# Patient Record
Sex: Female | Born: 1995 | Race: White | Hispanic: No | Marital: Single | Smoking: Never smoker
Health system: Southern US, Community
[De-identification: ages and names within clinical notes are randomized; demographics above are authoritative.]

## PROBLEM LIST (undated history)

## (undated) DIAGNOSIS — N200 Calculus of kidney: Secondary | ICD-10-CM

---

## 2014-06-23 ENCOUNTER — Inpatient Hospital Stay (HOSPITAL_BASED_OUTPATIENT_CLINIC_OR_DEPARTMENT_OTHER)
Admission: EM | Admit: 2014-06-23 | Discharge: 2014-06-26 | DRG: 660 | Disposition: A | Discharge status: Home / Self Care | Payer: No Typology Code available for payment source | Attending: Urology | Admitting: Urology

## 2014-06-23 ENCOUNTER — Encounter (HOSPITAL_BASED_OUTPATIENT_CLINIC_OR_DEPARTMENT_OTHER): Payer: Self-pay | Admitting: Emergency Medicine

## 2014-06-23 DIAGNOSIS — N201 Calculus of ureter: Secondary | ICD-10-CM | POA: Diagnosis not present

## 2014-06-23 DIAGNOSIS — N12 Tubulo-interstitial nephritis, not specified as acute or chronic: Secondary | ICD-10-CM

## 2014-06-23 DIAGNOSIS — K5900 Constipation, unspecified: Secondary | ICD-10-CM | POA: Diagnosis present

## 2014-06-23 DIAGNOSIS — Z87442 Personal history of urinary calculi: Secondary | ICD-10-CM

## 2014-06-23 DIAGNOSIS — N132 Hydronephrosis with renal and ureteral calculous obstruction: Secondary | ICD-10-CM | POA: Diagnosis not present

## 2014-06-23 HISTORY — DX: Calculus of kidney: N20.0

## 2014-06-23 LAB — I-STAT CG4 LACTIC ACID, ED: LACTIC ACID, VENOUS: 0.74 mmol/L (ref 0.5–2.0)

## 2014-06-23 LAB — BASIC METABOLIC PANEL
ANION GAP: 6 (ref 5–15)
BUN: 13 mg/dL (ref 6–23)
CO2: 19 mmol/L (ref 19–32)
CREATININE: 1.28 mg/dL — AB (ref 0.50–1.10)
Calcium: 8.3 mg/dL — ABNORMAL LOW (ref 8.4–10.5)
Chloride: 104 mmol/L (ref 96–112)
GFR calc Af Amer: 69 mL/min — ABNORMAL LOW (ref >90)
GFR, EST NON AFRICAN AMERICAN: 60 mL/min — AB (ref >90)
Glucose, Bld: 125 mg/dL — ABNORMAL HIGH (ref 70–99)
Potassium: 4.4 mmol/L (ref 3.5–5.1)
SODIUM: 129 mmol/L — AB (ref 135–145)

## 2014-06-23 LAB — URINALYSIS, ROUTINE W REFLEX MICROSCOPIC
Bilirubin Urine: NEGATIVE
GLUCOSE, UA: NEGATIVE mg/dL
Ketones, ur: NEGATIVE mg/dL
Nitrite: NEGATIVE
PH: 6 (ref 5.0–8.0)
Protein, ur: 100 mg/dL — AB
SPECIFIC GRAVITY, URINE: 1.009 (ref 1.005–1.030)
Urobilinogen, UA: 0.2 mg/dL (ref 0.0–1.0)

## 2014-06-23 LAB — CBC WITH DIFFERENTIAL/PLATELET
BASOS PCT: 0 % (ref 0–1)
Basophils Absolute: 0 10*3/uL (ref 0.0–0.1)
Eosinophils Absolute: 0 10*3/uL (ref 0.0–0.7)
Eosinophils Relative: 0 % (ref 0–5)
HEMATOCRIT: 36.1 % (ref 36.0–46.0)
Hemoglobin: 12.7 g/dL (ref 12.0–15.0)
Lymphocytes Relative: 5 % — ABNORMAL LOW (ref 12–46)
Lymphs Abs: 1 10*3/uL (ref 0.7–4.0)
MCH: 31.6 pg (ref 26.0–34.0)
MCHC: 35.2 g/dL (ref 30.0–36.0)
MCV: 89.8 fL (ref 78.0–100.0)
MONOS PCT: 10 % (ref 3–12)
Monocytes Absolute: 1.9 10*3/uL — ABNORMAL HIGH (ref 0.1–1.0)
Neutro Abs: 17.2 10*3/uL — ABNORMAL HIGH (ref 1.7–7.7)
Neutrophils Relative %: 85 % — ABNORMAL HIGH (ref 43–77)
Platelets: 182 10*3/uL (ref 150–400)
RBC: 4.02 MIL/uL (ref 3.87–5.11)
RDW: 12.5 % (ref 11.5–15.5)
WBC: 20.2 10*3/uL — ABNORMAL HIGH (ref 4.0–10.5)

## 2014-06-23 LAB — URINE MICROSCOPIC-ADD ON

## 2014-06-23 MED ORDER — ACETAMINOPHEN 325 MG PO TABS
650.0000 mg | ORAL_TABLET | Freq: Once | ORAL | Status: AC
  Administered 2014-06-23: 650 mg via ORAL
  Filled 2014-06-23: qty 2

## 2014-06-23 MED ORDER — ONDANSETRON HCL 4 MG/2ML IJ SOLN
4.0000 mg | Freq: Once | INTRAMUSCULAR | Status: AC
  Administered 2014-06-23: 4 mg via INTRAVENOUS
  Filled 2014-06-23: qty 2

## 2014-06-23 MED ORDER — HYDROMORPHONE HCL 1 MG/ML IJ SOLN
1.0000 mg | Freq: Once | INTRAMUSCULAR | Status: AC
Start: 2014-06-23 — End: 2014-06-23
  Administered 2014-06-23: 1 mg via INTRAVENOUS
  Filled 2014-06-23: qty 1

## 2014-06-23 MED ORDER — HYDROMORPHONE HCL 1 MG/ML IJ SOLN
1.0000 mg | Freq: Once | INTRAMUSCULAR | Status: AC
  Administered 2014-06-23: 1 mg via INTRAVENOUS
  Filled 2014-06-23: qty 1

## 2014-06-23 MED ORDER — SODIUM CHLORIDE 0.9 % IV BOLUS (SEPSIS)
1000.0000 mL | Freq: Once | INTRAVENOUS | Status: AC
  Administered 2014-06-23: 1000 mL via INTRAVENOUS

## 2014-06-23 MED ORDER — DEXTROSE 5 % IV SOLN
1.0000 g | Freq: Once | INTRAVENOUS | Status: AC
  Administered 2014-06-23: 1 g via INTRAVENOUS

## 2014-06-23 MED ORDER — CEFTRIAXONE SODIUM 1 G IJ SOLR
INTRAMUSCULAR | Status: AC
  Administered 2014-06-23: 20:00:00
  Filled 2014-06-23: qty 10

## 2014-06-23 NOTE — ED Notes (Signed)
Patient in NAD upon arrival  Urology to be paged

## 2014-06-23 NOTE — ED Notes (Signed)
Patient repots that she was dx with a 3mm kidney stone to her right side. Patient reports that the medications are not helping

## 2014-06-23 NOTE — ED Notes (Signed)
Assisted pt with calling parents, pt left voicemail on fathers mobile.

## 2014-06-23 NOTE — ED Notes (Signed)
[facility] and oriented x 4 and in NAD Patient currently rates pain 4/10 and states that previously given medications have decreased pain level Awaiting urology consult Call bell in reach

## 2014-06-23 NOTE — ED Notes (Signed)
[facility] here, report given, no changes, pain and nausea meds given at time of transfer.

## 2014-06-23 NOTE — ED Notes (Signed)
Bed: [id number] Expected date:  Expected time:  Means of arrival:  Comments: MHP infected stone

## 2014-06-23 NOTE — ED Notes (Signed)
Presents w/ Rt sided flank pain, states unable to pass kidney stone, has some nausea. Facial grimmacing noted

## 2014-06-23 NOTE — ED Notes (Signed)
MD at bedside. 

## 2014-06-23 NOTE — ED Notes (Addendum)
Pending arrival of [facility], [facility], reports pain and nausea remain. VSS. Alert, NAD, calm, interactive.

## 2014-06-23 NOTE — ED Provider Notes (Signed)
CSN: [id number]     Arrival date & time 06/23/14  1801 History   First MD Initiated Contact with Patient 06/23/14 1806     Chief Complaint  Patient presents with  . Flank Pain     (Consider location/radiation/quality/duration/timing/severity/associated sxs/prior Treatment) HPI Comments: Patient presents with uncontrolled right flank pain which started 4 days ago. Patient was diagnosed with a 3 mm right-sided UVJ stone 3 days ago by CT scan. She has been on narcotic pain medications at home with mild relief. Tonight her pain was worse. It is been associated with nausea. No reported fever. No chest pain or shortness of breath. She has been urinating. She notes constipation due to the pain medications. No other treatments prior to arrival.    CT scan from [facility] ED on 06/20/14:  IMPRESSION:High-grade obstruction on the right due to 3 mm stone at the ureterovesical junction.  The history is provided by the patient.    Past Medical History  Diagnosis Date  . Kidney stone    History reviewed. No pertinent past surgical history. History reviewed. No pertinent family history. History  Substance Use Topics  . Smoking status: Never Smoker   . Smokeless tobacco: Not on file  . Alcohol Use: No   OB History    No data available     Review of Systems  Constitutional: Negative for fever.  HENT: Negative for rhinorrhea and sore throat.   Eyes: Negative for redness.  Respiratory: Negative for cough.   Cardiovascular: Negative for chest pain.  Gastrointestinal: Positive for nausea and constipation. Negative for vomiting, abdominal pain and diarrhea.  Genitourinary: Positive for hematuria and flank pain. Negative for dysuria.  Musculoskeletal: Negative for myalgias.  Skin: Negative for rash.  Neurological: Negative for headaches.    Allergies  Review of patient's allergies indicates no known allergies.  Home Medications   Prior to Admission medications   Not on File   BP  132/75 mmHg  Pulse 131  Temp(Src) 100.1 F (37.8 C) (Oral)  Resp 16  Ht  (1.727 m)  Wt 190 lb (86.183 kg)  BMI 28.90 kg/m2  SpO2 100%  LMP 06/08/2014   Physical Exam  Constitutional: She appears well-developed and well-nourished. She appears distressed.  HENT:  Head: Normocephalic and atraumatic.  Mouth/Throat: Oropharynx is clear and moist.  Eyes: Conjunctivae are normal. Right eye exhibits no discharge. Left eye exhibits no discharge.  Neck: Normal range of motion. Neck supple.  Cardiovascular: Regular rhythm and normal heart sounds.  Tachycardia present.   Pulmonary/Chest: Effort normal and breath sounds normal.  Abdominal: Soft. There is no tenderness. CVA tenderness: Right.  Neurological: She is alert.  Skin: Skin is warm and dry.  Psychiatric: She has a normal mood and affect.  Nursing note and vitals reviewed.   ED Course  Procedures (including critical care time) Labs Review Labs Reviewed  CBC WITH DIFFERENTIAL/PLATELET - Abnormal; Notable for the following:    WBC 20.2 (*)    Neutrophils Relative % 85 (*)    Neutro Abs 17.2 (*)    Lymphocytes Relative 5 (*)    Monocytes Absolute 1.9 (*)    All other components within normal limits  BASIC METABOLIC PANEL - Abnormal; Notable for the following:    Sodium 129 (*)    Glucose, Bld 125 (*)    Creatinine, Ser 1.28 (*)    Calcium 8.3 (*)    GFR calc non Af Amer 60 (*)    GFR calc Af Amer 69 (*)  All other components within normal limits  URINALYSIS, ROUTINE W REFLEX MICROSCOPIC - Abnormal; Notable for the following:    APPearance CLOUDY (*)    Hgb urine dipstick SMALL (*)    Protein, ur 100 (*)    Leukocytes, UA MODERATE (*)    All other components within normal limits  URINE MICROSCOPIC-ADD ON - Abnormal; Notable for the following:    Squamous Epithelial / LPF MANY (*)    Bacteria, UA MANY (*)    All other components within normal limits  I-STAT CG4 LACTIC ACID, ED    Imaging Review No results  found.   EKG Interpretation None       6:23 PM Patient seen and examined. Work-up initiated. Medications ordered.   Vital signs reviewed and are as follows: BP 132/75 mmHg  Pulse 131  Temp(Src) 100.1 F (37.8 C) (Oral)  Resp 16  Ht 5\' 8"  (1.727 m)  Wt 190 lb (86.183 kg)  BMI 28.90 kg/m2  SpO2 100%  LMP 06/08/2014  9:58 PM Spoke with Dr. Berneice HeinrichManny. [facility].   10:04 PM Spoke with Dr. Patria Maneampos who was made aware patient going to ED.   MDM   Final diagnoses:  Pyelonephritis  Right ureteral stone   Admit, pyelo with ureteral obstruction.     Renne CriglerJoshua Tarl Cephas, PA-C 06/23/14 [id number]  Geoffery Lyonsouglas Delo, MD 06/23/14 [phone]

## 2014-06-24 ENCOUNTER — Inpatient Hospital Stay (HOSPITAL_COMMUNITY): Payer: No Typology Code available for payment source

## 2014-06-24 ENCOUNTER — Emergency Department (HOSPITAL_COMMUNITY): Payer: No Typology Code available for payment source | Admitting: Anesthesiology

## 2014-06-24 ENCOUNTER — Encounter (HOSPITAL_COMMUNITY)
Admission: EM | Disposition: A | Discharge status: Home / Self Care | Payer: Self-pay | Source: Home / Self Care | Attending: Urology

## 2014-06-24 DIAGNOSIS — N132 Hydronephrosis with renal and ureteral calculous obstruction: Secondary | ICD-10-CM | POA: Diagnosis present

## 2014-06-24 DIAGNOSIS — K5900 Constipation, unspecified: Secondary | ICD-10-CM | POA: Diagnosis present

## 2014-06-24 DIAGNOSIS — Z87442 Personal history of urinary calculi: Secondary | ICD-10-CM | POA: Diagnosis not present

## 2014-06-24 DIAGNOSIS — N201 Calculus of ureter: Secondary | ICD-10-CM | POA: Diagnosis present

## 2014-06-24 HISTORY — PX: CYSTOSCOPY W/ URETERAL STENT PLACEMENT: SHX1429

## 2014-06-24 LAB — PREGNANCY, URINE: Preg Test, Ur: NEGATIVE

## 2014-06-24 SURGERY — CYSTOSCOPY, WITH RETROGRADE PYELOGRAM AND URETERAL STENT INSERTION
Anesthesia: General | Site: Ureter | Laterality: Bilateral

## 2014-06-24 MED ORDER — FENTANYL CITRATE (PF) 100 MCG/2ML IJ SOLN
INTRAMUSCULAR | Status: DC | PRN
  Administered 2014-06-24 (×2): 50 ug via INTRAVENOUS

## 2014-06-24 MED ORDER — CEFTRIAXONE SODIUM IN DEXTROSE 40 MG/ML IV SOLN
2.0000 g | INTRAVENOUS | Status: DC
  Administered 2014-06-24 – 2014-06-26 (×3): 2 g via INTRAVENOUS
  Filled 2014-06-24 (×3): qty 50

## 2014-06-24 MED ORDER — IOHEXOL 300 MG/ML  SOLN
INTRAMUSCULAR | Status: DC | PRN
  Administered 2014-06-24: 100 mL

## 2014-06-24 MED ORDER — BELLADONNA ALKALOIDS-OPIUM 16.2-60 MG RE SUPP
RECTAL | Status: AC
  Filled 2014-06-24: qty 1

## 2014-06-24 MED ORDER — KCL IN DEXTROSE-NACL 20-5-0.45 MEQ/L-%-% IV SOLN
INTRAVENOUS | Status: DC
  Administered 2014-06-24: 14:00:00 via INTRAVENOUS
  Administered 2014-06-24: 100 mL/h via INTRAVENOUS
  Administered 2014-06-25 (×2): via INTRAVENOUS
  Filled 2014-06-24 (×5): qty 1000

## 2014-06-24 MED ORDER — DEXAMETHASONE SODIUM PHOSPHATE 10 MG/ML IJ SOLN
INTRAMUSCULAR | Status: DC | PRN
  Administered 2014-06-24: 10 mg via INTRAVENOUS

## 2014-06-24 MED ORDER — DOCUSATE SODIUM 100 MG PO CAPS
100.0000 mg | ORAL_CAPSULE | Freq: Two times a day (BID) | ORAL | Status: DC
  Administered 2014-06-24 – 2014-06-26 (×5): 100 mg via ORAL
  Filled 2014-06-24 (×7): qty 1

## 2014-06-24 MED ORDER — LACTATED RINGERS IV SOLN
INTRAVENOUS | Status: DC | PRN
  Administered 2014-06-24: 01:00:00 via INTRAVENOUS

## 2014-06-24 MED ORDER — LIDOCAINE HCL 2 % EX GEL
CUTANEOUS | Status: AC
  Filled 2014-06-24: qty 10

## 2014-06-24 MED ORDER — OXYCODONE HCL 5 MG PO TABS: 5.0000 mg | ORAL_TABLET | Freq: Once | ORAL | Status: DC | PRN

## 2014-06-24 MED ORDER — LIDOCAINE HCL (CARDIAC) 20 MG/ML IV SOLN
INTRAVENOUS | Status: DC | PRN
  Administered 2014-06-24: 100 mg via INTRAVENOUS

## 2014-06-24 MED ORDER — 0.9 % SODIUM CHLORIDE (POUR BTL) OPTIME
TOPICAL | Status: DC | PRN
  Administered 2014-06-24: 1000 mL

## 2014-06-24 MED ORDER — ONDANSETRON HCL 4 MG/2ML IJ SOLN
4.0000 mg | INTRAMUSCULAR | Status: DC | PRN
  Administered 2014-06-24 – 2014-06-25 (×2): 4 mg via INTRAVENOUS
  Filled 2014-06-24 (×2): qty 2

## 2014-06-24 MED ORDER — OXYCODONE HCL 5 MG/5ML PO SOLN
5.0000 mg | Freq: Once | ORAL | Status: DC | PRN
  Filled 2014-06-24: qty 5

## 2014-06-24 MED ORDER — FENTANYL CITRATE (PF) 100 MCG/2ML IJ SOLN: 25.0000 ug | INTRAMUSCULAR | Status: DC | PRN

## 2014-06-24 MED ORDER — PROPOFOL 10 MG/ML IV BOLUS
INTRAVENOUS | Status: AC
  Filled 2014-06-24: qty 20

## 2014-06-24 MED ORDER — LIDOCAINE HCL (CARDIAC) 20 MG/ML IV SOLN
INTRAVENOUS | Status: AC
  Filled 2014-06-24: qty 5

## 2014-06-24 MED ORDER — SODIUM CHLORIDE 0.9 % IR SOLN
Status: DC | PRN
  Administered 2014-06-24: 3000 mL

## 2014-06-24 MED ORDER — MIDAZOLAM HCL 5 MG/5ML IJ SOLN
INTRAMUSCULAR | Status: DC | PRN
  Administered 2014-06-24: 2 mg via INTRAVENOUS

## 2014-06-24 MED ORDER — FENTANYL CITRATE (PF) 100 MCG/2ML IJ SOLN
INTRAMUSCULAR | Status: AC
  Filled 2014-06-24: qty 2

## 2014-06-24 MED ORDER — ONDANSETRON HCL 4 MG/2ML IJ SOLN
INTRAMUSCULAR | Status: DC | PRN
  Administered 2014-06-24: 4 mg via INTRAVENOUS

## 2014-06-24 MED ORDER — SENNA 8.6 MG PO TABS
1.0000 | ORAL_TABLET | Freq: Two times a day (BID) | ORAL | Status: DC
  Administered 2014-06-24 – 2014-06-26 (×5): 8.6 mg via ORAL
  Filled 2014-06-24 (×5): qty 1

## 2014-06-24 MED ORDER — ONDANSETRON HCL 4 MG/2ML IJ SOLN: 4.0000 mg | Freq: Four times a day (QID) | INTRAMUSCULAR | Status: DC | PRN

## 2014-06-24 MED ORDER — OXYCODONE-ACETAMINOPHEN 5-325 MG PO TABS
1.0000 | ORAL_TABLET | ORAL | Status: DC | PRN
  Administered 2014-06-24 – 2014-06-25 (×2): 1 via ORAL
  Administered 2014-06-26 (×2): 2 via ORAL
  Filled 2014-06-24: qty 2
  Filled 2014-06-24 (×2): qty 1
  Filled 2014-06-24: qty 2

## 2014-06-24 MED ORDER — SUCCINYLCHOLINE CHLORIDE 20 MG/ML IJ SOLN
INTRAMUSCULAR | Status: DC | PRN
  Administered 2014-06-24: 100 mg via INTRAVENOUS

## 2014-06-24 MED ORDER — HYDROMORPHONE HCL 1 MG/ML IJ SOLN
0.5000 mg | INTRAMUSCULAR | Status: DC | PRN
  Administered 2014-06-24 – 2014-06-25 (×5): 1 mg via INTRAVENOUS
  Filled 2014-06-24 (×5): qty 1

## 2014-06-24 MED ORDER — PROPOFOL 10 MG/ML IV BOLUS
INTRAVENOUS | Status: DC | PRN
  Administered 2014-06-24: 200 mg via INTRAVENOUS

## 2014-06-24 MED ORDER — LEVOTHYROXINE SODIUM 50 MCG PO TABS
50.0000 ug | ORAL_TABLET | Freq: Every day | ORAL | Status: DC
  Administered 2014-06-24 – 2014-06-26 (×3): 50 ug via ORAL
  Filled 2014-06-24 (×3): qty 1

## 2014-06-24 MED ORDER — MIDAZOLAM HCL 2 MG/2ML IJ SOLN
INTRAMUSCULAR | Status: AC
  Filled 2014-06-24: qty 2

## 2014-06-24 SURGICAL SUPPLY — 5 items
CATH BEACON 5.038 65CM KMP-01 (CATHETERS) ×3 IMPLANT
CATH INTERMIT  6FR 70CM (CATHETERS) ×3 IMPLANT
GUIDEWIRE ANG ZIPWIRE 038X150 (WIRE) ×3 IMPLANT
PACK CYSTO (CUSTOM PROCEDURE TRAY) ×3 IMPLANT
STENT POLARIS 5FRX24 (STENTS) ×3 IMPLANT

## 2014-06-24 NOTE — Anesthesia Postprocedure Evaluation (Signed)
Anesthesia Post Note  Patient: Jill ReeseKelsey Stein  Procedure(s) Performed: Procedure(s) (LRB): CYSTOSCOPY WITH RETROGRADE PYELOGRAM/ bilateral right  URETERAL STENT PLACEMENT (Bilateral)  Anesthesia type: General  Patient location: PACU  Post pain: Pain level controlled and Adequate analgesia  Post assessment: Post-op Vital signs reviewed, Patient's Cardiovascular Status Stable, Respiratory Function Stable, Patent Airway and Pain level controlled  Last Vitals:  Filed Vitals:   06/24/14 0230  BP: 125/83  Pulse: 98  Temp:   Resp: 18    Post vital signs: Reviewed and stable  Level of consciousness: awake, alert  and oriented  Complications: No apparent anesthesia complications

## 2014-06-24 NOTE — Brief Op Note (Signed)
06/23/2014 - 06/24/2014  1:49 AM  PATIENT:  Jill ReeseKelsey Dishner  19 y.o. female  PRE-OPERATIVE DIAGNOSIS:  right ureteral stone,fever  POST-OPERATIVE DIAGNOSIS:  right ureteral stone fever  PROCEDURE:  Procedure(s): CYSTOSCOPY WITH RETROGRADE PYELOGRAM/ bilateral right  URETERAL STENT PLACEMENT (Bilateral)  SURGEON:  Surgeon(s) and Role:    * Sebastian Acheheodore Gillie Fleites, MD - Primary  PHYSICIAN ASSISTANT:   ASSISTANTS: none   ANESTHESIA:   general  EBL:  Total I/O In: 3290 [P.O.:240; I.V.:1000; IV Piggyback:2050] Out: -   BLOOD ADMINISTERED:none  DRAINS: none   LOCAL MEDICATIONS USED:  NONE  SPECIMEN:  Source of Specimen:  urine after right stent placed  DISPOSITION OF SPECIMEN:  microbilogy for culture  COUNTS:  YES  TOURNIQUET:  * No tourniquets in log *  DICTATION: .Other Dictation: Dictation Number [id number]  PLAN OF CARE: Admit to inpatient   PATIENT DISPOSITION:  PACU - hemodynamically stable.   Delay start of Pharmacological VTE agent (>24hrs) due to surgical blood loss or risk of bleeding: yes

## 2014-06-24 NOTE — Op Note (Signed)
Jill Stein, Jill Stein                ACCOUNT NO.:  [id number]  MEDICAL RECORD NO.:  [id number]  LOCATION:  1421                         FACILITY:  [facility]  PHYSICIAN:  Sebastian Ache, MD     DATE OF BIRTH:  11-Jun-1995  DATE OF PROCEDURE: 06/24/2014                              OPERATIVE REPORT   DIAGNOSIS:  Right distal ureteral stone, urosepsis.  PROCEDURE: 1. Cystoscopy with bilateral retrograde pyelogram interpretation. 2. Right ureteral stent placement, 5 x 24 Polaris, no tether.  ESTIMATED BLOOD LOSS:  Nil.  COMPLICATIONS:  None.  SPECIMEN:  Urine for culture and sensitivity after stent placement.  FINDINGS: 1. Unremarkable left retrograde pyelogram. 2. Unremarkable urinary bladder. 3. Right mild hydroureteronephrosis at the level of the distal ureter.     No filling defects with known stone. 4. Significant tortuosity of the proximal right ureter without focal     stricture, felt to be likely secondary to obstruction.  INDICATION:  Jill Stein is a 19 year old young lady, who has had a several day prodrome with  colicky right flank pain, she was admitted at our referring facility with distal right ureteral stone.  She was given a trial of medical therapy; however, she became febrile today at 103.  She was referred for further management. She was evaluated and indeed found to be febrile, tachycardic, leukocytosis worrisome for urosepsis in setting of ureteral stone, which she denied interval passage of.  She had some bilateral pain of the right greater than left per report from her referring imaging and nephrolithiasis on the right side.  The options were discussed for management including cysto and right ureteral stent placement versus bilateral pending intraoperative findings for renal decompression and she wished to proceed.  Informed consent was obtained and placed in medical record.  OPERATIVE REPORT:  The patient being Jill Stein, procedure being cysto bilateral  retrograde and right versus bilateral ureteral stenting was confirmed.  Procedure was carried out.  Time-out was performed. Intravenous antibiotics were administered.  General LMA anesthesia was induced.  Patient was placed into a low lithotomy position.  Sterile field was created by prepping and draping the patient's vagina, introitus, and proximal thighs using iodine x3.  Next, cystourethroscopy was performed using 23-French rigid cystoscope with 30-degree offset lens.  Inspection of bladder revealed no diverticula, calcifications, papular lesions.  The left ureteral orifice was cannulated with a 6- French end-hole catheter and left retrograde pyelogram was obtained.  Left retrograde pyelogram demonstrated a single left ureter, single system, left kidney.  No filling defects or narrowing noted.  It was felt that there was no left obstruction, therefore left stenting was not warranted.  Attention was directed to the right side.  The right ureteral orifice was cannulated with a 6-French end-hole catheter and right retrograde pyelogram was obtained.  Very gentle right retrograde pyelogram demonstrated single right ureter, single system, right kidney.  There was a filling defect in distal ureter consistent with known stone.  There was mild hydroureteronephrosis above this.  There was significant tortuosity of the proximal ureter.  This was felt to be most likely due to obstructive changes.  A 0.038 Sensor wire was advanced up the ureter; however it  would not achieve the angulation appropriate to get pass this proximal tortuosity.  A 0.038 zip angled-tip wire was also tried with multiple angulations that e would not pass tortuosity as such the open-ended catheter was exchanged for a long KMP angled-tip catheter and using the angled-tip catheter, multiple angulations of the proximal tortuosity and by placing the patient in slightly head-down position, straight in the ureter, Sensor wire  access was achieved across this into the level of the upper pole with the wire across these tortuosity straightened.  Next a 5 x 24 Polaris-type stent was placed using cystoscopic and fluoroscopic guidance.  Good proximal and distal deployment were noted. Efflux of proteinaceous-appearing urine was seen around into the distal end of the stent.  A sample of which was set aside for microbiology. Procedure then terminated.  The patient tolerated procedure well.  There were no immediate periprocedural complications.  The patient was taken to postanesthesia care unit in stable condition.  Plan for hospital admission for IV antibiotics.          ______________________________ Sebastian Acheheodore Reese Stockman, MD     TM/MEDQ  D:  06/24/2014  T:  06/24/2014  Job:  [id number]

## 2014-06-24 NOTE — Progress Notes (Signed)
Order placed for Abd xray. Per radiology tech,  pt needs a urine pregnancy test done prior to abd xray. Order placed per protocol. Vwilliams,rn.

## 2014-06-24 NOTE — Transfer of Care (Signed)
Immediate Anesthesia Transfer of Care Note  Patient: Jill Stein  Procedure(s) Performed: Procedure(s): CYSTOSCOPY WITH RETROGRADE PYELOGRAM/ bilateral right  URETERAL STENT PLACEMENT (Bilateral)  Patient Location: PACU  Anesthesia Type:General  Level of Consciousness: sedated  Airway & Oxygen Therapy: Patient Spontanous Breathing and Patient connected to face mask oxygen  Post-op Assessment: Report given to RN and Post -op Vital signs reviewed and stable  Post vital signs: Reviewed and stable  Last Vitals:  Filed Vitals:   06/23/14 2336  BP: 131/78  Pulse: 98  Temp: 37.2 C  Resp: 20    Complications: No apparent anesthesia complications

## 2014-06-24 NOTE — Anesthesia Procedure Notes (Signed)
Procedure Name: Intubation Date/Time: 06/24/2014 1:02 AM Performed by: Doran ClayALDAY, Patrich Heinze R Pre-anesthesia Checklist: Patient identified, Emergency Drugs available, Suction available, Patient being monitored and Timeout performed Patient Re-evaluated:Patient Re-evaluated prior to inductionOxygen Delivery Method: Circle system utilized Preoxygenation: Pre-oxygenation with 100% oxygen Intubation Type: IV induction, Rapid sequence and Cricoid Pressure applied Laryngoscope Size: Mac and 4 Grade View: Grade I Tube size: 7.0 mm Number of attempts: 1 Airway Equipment and Method: Stylet Placement Confirmation: ETT inserted through vocal cords under direct vision,  positive ETCO2 and breath sounds checked- equal and bilateral Secured at: 22 cm Tube secured with: Tape Dental Injury: Teeth and Oropharynx as per pre-operative assessment

## 2014-06-24 NOTE — ED Notes (Signed)
Dr Manny at bedside  

## 2014-06-24 NOTE — ED Notes (Signed)
At bedside to obtain consent for surgery Patient stated that "Nothing was explained to me. I was told that I was going to 'be drained' and that was it. I don't know what's going on or how this is going to be done." Patient does not want to sign consent until she speaks with Dr. Berneice HeinrichManny again Gabriel RungJoe, Nursing Supervisor present in ED and aware Per Nursing Supervisor, patient to be sent down to OR and have consent signed after she speaks with MD

## 2014-06-24 NOTE — H&P (Signed)
Jill Stein is an 19 y.o. female.    Chief Complaint: Rt Ureteral Stone, Urosepsis  HPI:   1 - Rt Ureteral Stone - Pt with 37mm Rt UVJ stone diagnosted by CT at [facility] facility several days ago, given trial of medical therapy but deneis interval passage. Imagines not available for review, but report with solitary Rt 55mm UVJ stone. No prior or additional stones per report  2- Urosepsis - Pt with new fevers to 103, leukocytosis to 20K and tachycardia + bacteruria / pyuria c/w early urosepsis. No CX data to review. Rocephin started 4/30 at referring ER.  PMH unremarkable. Deneis prior surgery. No CV disease. NO bloo thinners. She is Ship broker at [employer], from [place]. Her next of Kin (father Viera) is avail at [phone]  Today Jill Stein is seen as emergent admission for above.   Past Medical History  Diagnosis Date  . Kidney stone     History reviewed. No pertinent past surgical history.  History reviewed. No pertinent family history. Social History:  reports that she has never smoked. She does not have any smokeless tobacco history on file. She reports that she does not drink alcohol or use illicit drugs.  Allergies: No Known Allergies   (Not in a hospital admission)  Results for orders placed or performed during the hospital encounter of 06/23/14 (from the past 48 hour(s))  CBC with Differential/Platelet     Status: Abnormal   Collection Time: 06/23/14  4:30 PM  Result Value Ref Range   WBC 20.2 (H) 4.0 - 10.5 K/uL   RBC 4.02 3.87 - 5.11 MIL/uL   Hemoglobin 12.7 12.0 - 15.0 g/dL   HCT 36.1 36.0 - 46.0 %   MCV 89.8 78.0 - 100.0 fL   MCH 31.6 26.0 - 34.0 pg   MCHC 35.2 30.0 - 36.0 g/dL   RDW 12.5 11.5 - 15.5 %   Platelets 182 150 - 400 K/uL   Neutrophils Relative % 85 (H) 43 - 77 %   Neutro Abs 17.2 (H) 1.7 - 7.7 K/uL   Lymphocytes Relative 5 (L) 12 - 46 %   Lymphs Abs 1.0 0.7 - 4.0 K/uL   Monocytes Relative 10 3 - 12 %   Monocytes Absolute 1.9 (H) 0.1 -  1.0 K/uL   Eosinophils Relative 0 0 - 5 %   Eosinophils Absolute 0.0 0.0 - 0.7 K/uL   Basophils Relative 0 0 - 1 %   Basophils Absolute 0.0 0.0 - 0.1 K/uL   WBC Morphology VACUOLATED NEUTROPHILS     Comment: MILD LEFT SHIFT (1-5% METAS, OCC MYELO, OCC BANDS) ATYPICAL LYMPHOCYTES   Basic metabolic panel     Status: Abnormal   Collection Time: 06/23/14  4:30 PM  Result Value Ref Range   Sodium 129 (L) 135 - 145 mmol/L   Potassium 4.4 3.5 - 5.1 mmol/L   Chloride 104 96 - 112 mmol/L   CO2 19 19 - 32 mmol/L   Glucose, Bld 125 (H) 70 - 99 mg/dL   BUN 13 6 - 23 mg/dL   Creatinine, Ser 1.28 (H) 0.50 - 1.10 mg/dL   Calcium 8.3 (L) 8.4 - 10.5 mg/dL   GFR calc non Af Amer 60 (L) >90 mL/min   GFR calc Af Amer 69 (L) >90 mL/min    Comment: (NOTE) The eGFR has been calculated using the CKD EPI equation. This calculation has not been validated in all clinical situations. eGFR's persistently <90 mL/min signify possible Chronic Kidney Disease.  Anion gap 6 5 - 15  I-Stat CG4 Lactic Acid, ED     Status: None   Collection Time: 06/23/14  7:36 PM  Result Value Ref Range   Lactic Acid, Venous 0.74 0.5 - 2.0 mmol/L  Urinalysis, Routine w reflex microscopic     Status: Abnormal   Collection Time: 06/23/14  9:16 PM  Result Value Ref Range   Color, Urine YELLOW YELLOW   APPearance CLOUDY (A) CLEAR   Specific Gravity, Urine 1.009 1.005 - 1.030   pH 6.0 5.0 - 8.0   Glucose, UA NEGATIVE NEGATIVE mg/dL   Hgb urine dipstick SMALL (A) NEGATIVE   Bilirubin Urine NEGATIVE NEGATIVE   Ketones, ur NEGATIVE NEGATIVE mg/dL   Protein, ur 100 (A) NEGATIVE mg/dL   Urobilinogen, UA 0.2 0.0 - 1.0 mg/dL   Nitrite NEGATIVE NEGATIVE   Leukocytes, UA MODERATE (A) NEGATIVE  Urine microscopic-add on     Status: Abnormal   Collection Time: 06/23/14  9:16 PM  Result Value Ref Range   Squamous Epithelial / LPF MANY (A) RARE   WBC, UA 11-20 <3 WBC/hpf   RBC / HPF 3-6 <3 RBC/hpf   Bacteria, UA MANY (A) RARE   No  results found.  Review of Systems  Constitutional: Positive for fever, chills and malaise/fatigue.  HENT: Negative.   Eyes: Negative.   Respiratory: Negative.   Cardiovascular: Negative.   Gastrointestinal: Positive for nausea.  Genitourinary: Positive for flank pain.  Musculoskeletal: Negative.   Skin: Negative.   Neurological: Negative.   Endo/Heme/Allergies: Negative.   Psychiatric/Behavioral: Negative.     Blood pressure 131/78, pulse 98, temperature 98.9 F (37.2 C), temperature source Oral, resp. rate 20, height $RemoveBe'5\' 8"'zgPHUWiMs$  (1.727 m), weight 86.183 kg (190 lb), last menstrual period 06/08/2014, SpO2 100 %. Physical Exam  Constitutional: She appears well-developed.  HENT:  Head: Normocephalic.  Eyes: Pupils are equal, round, and reactive to light.  Neck: Normal range of motion.  Cardiovascular:  Regular tachycardia  Respiratory: Effort normal.  GI: Soft.  Genitourinary:  Rt > Lt bilateral flank pain  Musculoskeletal: Normal range of motion.  Neurological: She is alert.  Skin: Skin is warm.  Psychiatric: She has a normal mood and affect. Her behavior is normal. Judgment and thought content normal.     Assessment/Plan  1 - Rt Ureteral Stone - Discussed with pt priorities of renal drainage, ABX / clearance of infectious parameters, then subsequent definitive stone treatment a later date as safest way to proceed. Modes of renal decompression including neph tube v. Stent discussed and we both agree stent most prudent. Will plan for emergent cysto, bilat retrogrades adn Rt v. Bilateral ureteral stenting as she does have some left sided flank pain as well and actual CT image unavailable for review. Risks, benefits, alternatives discussed in detail.   2- Urosepsis - Rocephin already given today. Will obtain new post stent UCX today, admit post-op with plan to continue empiric IVABX until afebrile x 24 hrs    Jill Stein 06/24/2014, 12:10 AM

## 2014-06-24 NOTE — Anesthesia Preprocedure Evaluation (Signed)
Anesthesia Evaluation  Patient identified by MRN, date of birth, ID band Patient awake    Reviewed: Allergy & Precautions, NPO status , Patient's Chart, lab work & pertinent test results  Airway Mallampati: I   Neck ROM: full    Dental   Pulmonary neg pulmonary ROS,  breath sounds clear to auscultation        Cardiovascular negative cardio ROS  Rhythm:regular Rate:Normal     Neuro/Psych    GI/Hepatic   Endo/Other    Renal/GU      Musculoskeletal   Abdominal   Peds  Hematology   Anesthesia Other Findings   Reproductive/Obstetrics                             Anesthesia Physical Anesthesia Plan  ASA: I and emergent  Anesthesia Plan: General   Post-op Pain Management:    Induction: Intravenous  Airway Management Planned: Oral ETT  Additional Equipment:   Intra-op Plan:   Post-operative Plan: Extubation in OR  Informed Consent: I have reviewed the patients History and Physical, chart, labs and discussed the procedure including the risks, benefits and alternatives for the proposed anesthesia with the patient or authorized representative who has indicated his/her understanding and acceptance.     Plan Discussed with: CRNA, Anesthesiologist and Surgeon  Anesthesia Plan Comments:         Anesthesia Quick Evaluation  

## 2014-06-24 NOTE — ED Provider Notes (Signed)
MSE was initiated and I personally evaluated the patient and placed orders (if any) at  12:08 AM on Jun 24, 2014.  The patient appears stable and urology was at bedside on my examination, which included cardiac, pulmonary, and GI, which were unremarkable.  Pt in minimal pain.  Admitted to urology in stable condition  Mirian MoMatthew Joson Sapp, MD 06/24/14 [phone]

## 2014-06-24 NOTE — Progress Notes (Signed)
Dr Manny into see patient 

## 2014-06-25 ENCOUNTER — Encounter (HOSPITAL_COMMUNITY): Payer: Self-pay | Admitting: Certified Registered Nurse Anesthetist

## 2014-06-25 ENCOUNTER — Inpatient Hospital Stay (HOSPITAL_COMMUNITY): Payer: No Typology Code available for payment source | Admitting: Certified Registered Nurse Anesthetist

## 2014-06-25 ENCOUNTER — Encounter (HOSPITAL_COMMUNITY)
Admission: EM | Disposition: A | Discharge status: Home / Self Care | Payer: Self-pay | Source: Home / Self Care | Attending: Urology

## 2014-06-25 HISTORY — PX: CYSTOSCOPY WITH RETROGRADE PYELOGRAM, URETEROSCOPY AND STENT PLACEMENT: SHX5789

## 2014-06-25 HISTORY — PX: CYSTOSCOPY/URETEROSCOPY/HOLMIUM LASER: SHX6545

## 2014-06-25 LAB — COMPREHENSIVE METABOLIC PANEL
ALT: 23 U/L (ref 14–54)
ANION GAP: 7 (ref 5–15)
AST: 22 U/L (ref 15–41)
Albumin: 2.5 g/dL — ABNORMAL LOW (ref 3.5–5.0)
Alkaline Phosphatase: 95 U/L (ref 38–126)
BUN: 12 mg/dL (ref 6–20)
CALCIUM: 8.4 mg/dL — AB (ref 8.9–10.3)
CHLORIDE: 112 mmol/L — AB (ref 101–111)
CO2: 21 mmol/L — AB (ref 22–32)
CREATININE: 0.82 mg/dL (ref 0.44–1.00)
GLUCOSE: 143 mg/dL — AB (ref 70–99)
Potassium: 4 mmol/L (ref 3.5–5.1)
SODIUM: 140 mmol/L (ref 135–145)
Total Bilirubin: 0.5 mg/dL (ref 0.3–1.2)
Total Protein: 5.7 g/dL — ABNORMAL LOW (ref 6.5–8.1)

## 2014-06-25 LAB — URINE CULTURE
CULTURE: NO GROWTH
Colony Count: NO GROWTH

## 2014-06-25 LAB — SURGICAL PCR SCREEN
MRSA, PCR: NEGATIVE
Staphylococcus aureus: NEGATIVE

## 2014-06-25 SURGERY — CYSTOURETEROSCOPY, WITH RETROGRADE PYELOGRAM AND STENT INSERTION
Anesthesia: General | Laterality: Right

## 2014-06-25 MED ORDER — PROPOFOL 10 MG/ML IV BOLUS
INTRAVENOUS | Status: AC
  Filled 2014-06-25: qty 20

## 2014-06-25 MED ORDER — MIDAZOLAM HCL 5 MG/5ML IJ SOLN
INTRAMUSCULAR | Status: DC | PRN
  Administered 2014-06-25: 1 mg via INTRAVENOUS

## 2014-06-25 MED ORDER — HYDROMORPHONE HCL 1 MG/ML IJ SOLN
INTRAMUSCULAR | Status: AC
  Filled 2014-06-25: qty 1

## 2014-06-25 MED ORDER — EPHEDRINE SULFATE 50 MG/ML IJ SOLN
INTRAMUSCULAR | Status: AC
  Filled 2014-06-25: qty 1

## 2014-06-25 MED ORDER — LACTATED RINGERS IV SOLN: INTRAVENOUS | Status: DC

## 2014-06-25 MED ORDER — EPHEDRINE SULFATE 50 MG/ML IJ SOLN
INTRAMUSCULAR | Status: DC | PRN
  Administered 2014-06-25: 5 mg via INTRAVENOUS

## 2014-06-25 MED ORDER — FENTANYL CITRATE (PF) 100 MCG/2ML IJ SOLN
INTRAMUSCULAR | Status: AC
  Filled 2014-06-25: qty 2

## 2014-06-25 MED ORDER — IOHEXOL 300 MG/ML  SOLN
INTRAMUSCULAR | Status: DC | PRN
  Administered 2014-06-25: 10 mL

## 2014-06-25 MED ORDER — SODIUM CHLORIDE 0.9 % IR SOLN
Status: DC | PRN
  Administered 2014-06-25: 3000 mL

## 2014-06-25 MED ORDER — MIDAZOLAM HCL 2 MG/2ML IJ SOLN
INTRAMUSCULAR | Status: AC
  Filled 2014-06-25: qty 2

## 2014-06-25 MED ORDER — ONDANSETRON HCL 4 MG/2ML IJ SOLN
INTRAMUSCULAR | Status: AC
  Filled 2014-06-25: qty 2

## 2014-06-25 MED ORDER — LIDOCAINE HCL (CARDIAC) 20 MG/ML IV SOLN
INTRAVENOUS | Status: AC
  Filled 2014-06-25: qty 5

## 2014-06-25 MED ORDER — DEXAMETHASONE SODIUM PHOSPHATE 10 MG/ML IJ SOLN
INTRAMUSCULAR | Status: DC | PRN
  Administered 2014-06-25: 10 mg via INTRAVENOUS

## 2014-06-25 MED ORDER — ONDANSETRON HCL 4 MG/2ML IJ SOLN
INTRAMUSCULAR | Status: DC | PRN
  Administered 2014-06-25: 4 mg via INTRAVENOUS

## 2014-06-25 MED ORDER — SODIUM CHLORIDE 0.9 % IR SOLN
Status: DC | PRN
  Administered 2014-06-25: 1000 mL

## 2014-06-25 MED ORDER — LACTATED RINGERS IV SOLN
INTRAVENOUS | Status: DC | PRN
Start: 2014-06-25 — End: 2014-06-25
  Administered 2014-06-25: 16:00:00 via INTRAVENOUS

## 2014-06-25 MED ORDER — HYDROMORPHONE HCL 1 MG/ML IJ SOLN
0.5000 mg | INTRAMUSCULAR | Status: DC | PRN
  Administered 2014-06-25 (×2): 0.5 mg via INTRAVENOUS

## 2014-06-25 MED ORDER — SODIUM CHLORIDE 0.9 % IJ SOLN
INTRAMUSCULAR | Status: AC
  Filled 2014-06-25: qty 10

## 2014-06-25 MED ORDER — ONDANSETRON HCL 4 MG/2ML IJ SOLN: 4.0000 mg | Freq: Once | INTRAMUSCULAR | Status: DC | PRN

## 2014-06-25 MED ORDER — FENTANYL CITRATE (PF) 100 MCG/2ML IJ SOLN
INTRAMUSCULAR | Status: DC | PRN
  Administered 2014-06-25 (×2): 50 ug via INTRAVENOUS

## 2014-06-25 MED ORDER — DEXAMETHASONE SODIUM PHOSPHATE 10 MG/ML IJ SOLN
INTRAMUSCULAR | Status: AC
Start: 2014-06-25 — End: 2014-06-25
  Filled 2014-06-25: qty 1

## 2014-06-25 MED ORDER — PROPOFOL 10 MG/ML IV BOLUS
INTRAVENOUS | Status: DC | PRN
  Administered 2014-06-25: 200 mg via INTRAVENOUS

## 2014-06-25 MED ORDER — LIDOCAINE HCL (CARDIAC) 20 MG/ML IV SOLN
INTRAVENOUS | Status: DC | PRN
  Administered 2014-06-25: 80 mg via INTRAVENOUS

## 2014-06-25 SURGICAL SUPPLY — 23 items
BAG URINE DRAINAGE (UROLOGICAL SUPPLIES) IMPLANT
BASKET LASER NITINOL 1.9FR (BASKET) ×3 IMPLANT
BASKET STNLS GEMINI 4WIRE 3FR (BASKET) IMPLANT
BASKET ZERO TIP NITINOL 2.4FR (BASKET) IMPLANT
CATH INTERMIT  6FR 70CM (CATHETERS) ×3 IMPLANT
CLOTH BEACON ORANGE TIMEOUT ST (SAFETY) ×3 IMPLANT
ELECT REM PT RETURN 9FT ADLT (ELECTROSURGICAL)
ELECTRODE REM PT RTRN 9FT ADLT (ELECTROSURGICAL) IMPLANT
FIBER LASER FLEXIVA 1000 (UROLOGICAL SUPPLIES) IMPLANT
FIBER LASER FLEXIVA 200 (UROLOGICAL SUPPLIES) IMPLANT
FIBER LASER FLEXIVA 365 (UROLOGICAL SUPPLIES) IMPLANT
FIBER LASER FLEXIVA 550 (UROLOGICAL SUPPLIES) IMPLANT
FIBER LASER TRAC TIP (UROLOGICAL SUPPLIES) IMPLANT
GLOVE BIOGEL M STRL SZ7.5 (GLOVE) ×3 IMPLANT
GOWN STRL REUS W/TWL LRG LVL3 (GOWN DISPOSABLE) ×6 IMPLANT
GUIDEWIRE ANG ZIPWIRE 038X150 (WIRE) ×3 IMPLANT
GUIDEWIRE STR DUAL SENSOR (WIRE) ×3 IMPLANT
IV NS IRRIG 3000ML ARTHROMATIC (IV SOLUTION) IMPLANT
PACK CYSTO (CUSTOM PROCEDURE TRAY) ×3 IMPLANT
STENT POLARIS 5FRX24 (STENTS) ×3 IMPLANT
SYRINGE 10CC LL (SYRINGE) IMPLANT
SYRINGE IRR TOOMEY STRL 70CC (SYRINGE) IMPLANT
TUBE FEEDING 8FR 16IN STR KANG (MISCELLANEOUS) ×3 IMPLANT

## 2014-06-25 NOTE — Transfer of Care (Signed)
Immediate Anesthesia Transfer of Care Note  Patient: Jill Stein  Procedure(s) Performed: Procedure(s): CYSTOSCOPY WITH RETROGRADE PYELOGRAM, URETEROSCOPY WITH BASKETING OF STONE AND STENT EXCHANGE (Right) CYSTOSCOPY/URETEROSCOPY/HOLMIUM LASER (Right)  Patient Location: PACU  Anesthesia Type:General  Level of Consciousness:  sedated, patient cooperative and responds to stimulation  Airway & Oxygen Therapy:Patient Spontanous Breathing and Patient connected to face mask oxgen  Post-op Assessment:  Report given to PACU RN and Post -op Vital signs reviewed and stable  Post vital signs:  Reviewed and stable  Last Vitals:  Filed Vitals:   06/25/14 1515  BP: 119/76  Pulse: 65  Temp: 36.6 C  Resp: 18    Complications: No apparent anesthesia complications

## 2014-06-25 NOTE — Brief Op Note (Signed)
06/23/2014 - 06/25/2014  4:27 PM  PATIENT:  Crissie ReeseKelsey Channell  19 y.o. female  PRE-OPERATIVE DIAGNOSIS:  right ureteral stone  POST-OPERATIVE DIAGNOSIS:  right ureteral stone  PROCEDURE:  Cysto, Rt retrograde, Rt stent exchange (5x24 polaris with tether), Rt ureteroscopy with basketing of ston  SURGEON:  Surgeon(s) and Role:    * Sebastian Acheheodore Erion Weightman, MD - Primary  PHYSICIAN ASSISTANT:   ASSISTANTS: none   ANESTHESIA:   general  EBL:     BLOOD ADMINISTERED:none  DRAINS: none   LOCAL MEDICATIONS USED:  NONE  SPECIMEN:  Source of Specimen:  Rt distal ureteral stone  DISPOSITION OF SPECIMEN:  [facility] for compositional analysis  COUNTS:  YES  TOURNIQUET:  * No tourniquets in log *  DICTATION: .Other Dictation: Dictation Number [id number]  PLAN OF CARE: Admit to inpatient   PATIENT DISPOSITION:  PACU - hemodynamically stable.   Delay start of Pharmacological VTE agent (>24hrs) due to surgical blood loss or risk of bleeding: yes

## 2014-06-25 NOTE — Care Management Note (Signed)
Case Management Note  Patient Details  Name: Jill ReeseKelsey Stein MRN: [id number] Date of Birth: [birth year]  Subjective/Objective:   19 y/o f admitted w/ureteral stone w/hydronephrosis.                 Action/Plan:From home.    Expected Discharge Date:  06/25/14               Expected Discharge Plan:  Home/Self Care  In-House Referral:     Discharge planning Services  CM Consult  Post Acute Care Choice:    Choice offered to:     DME Arranged:    DME Agency:     HH Arranged:    HH Agency:     Status of Service:  In process, will continue to follow  Medicare Important Message Given:    Date Medicare IM Given:    Medicare IM give by:    Date Additional Medicare IM Given:    Additional Medicare Important Message give by:     If discussed at Long Length of Stay Meetings, dates discussed:    Additional Comments: POD#1 stent, for sx in am.  Lanier ClamMahabir, Marshall Kampf, RN 06/25/2014, 3:19 PM

## 2014-06-25 NOTE — Anesthesia Preprocedure Evaluation (Addendum)
Anesthesia Evaluation  Patient identified by MRN, date of birth, ID band Patient awake    Reviewed: Allergy & Precautions, NPO status , Patient's Chart, lab work & pertinent test results  Airway Mallampati: I  TM Distance: >3 FB     Dental   Pulmonary    Pulmonary exam normal       Cardiovascular Rhythm:Regular Rate:Normal     Neuro/Psych    GI/Hepatic   Endo/Other    Renal/GU Renal disease     Musculoskeletal   Abdominal   Peds  Hematology   Anesthesia Other Findings   Reproductive/Obstetrics                             Anesthesia Physical Anesthesia Plan  ASA: I  Anesthesia Plan: General   Post-op Pain Management:    Induction: Intravenous  Airway Management Planned: LMA  Additional Equipment:   Intra-op Plan:   Post-operative Plan: Extubation in OR  Informed Consent: I have reviewed the patients History and Physical, chart, labs and discussed the procedure including the risks, benefits and alternatives for the proposed anesthesia with the patient or authorized representative who has indicated his/her understanding and acceptance.     Plan Discussed with: CRNA, Anesthesiologist and Surgeon  Anesthesia Plan Comments:         Anesthesia Quick Evaluation

## 2014-06-25 NOTE — Progress Notes (Signed)
Patient back post op, alert and oriented, still in and out of sleep, denies pain, pain med given in PACU. In bed resting, will continue to monitor patient. Father at the bedside.

## 2014-06-25 NOTE — Progress Notes (Signed)
1 Day Post-Op  Subjective:  1 - Rt Ureteral Stone - Pt with 3mm Rt UVJ stone diagn[facility] several days ago, given trial of medical therapy but deneis interval passage. Underwent Rt JJ stent 5/2 in setting of febrile UTI and retrograde confirmed likely small distal stone. NO interval passage. Stone not easily seen on KUB post-stent.   2- Urosepsis - Pt with new fevers to 103, leukocytosis to 20K and tachycardia + bacteruria / pyuria c/w early urosepsis. UCX pending. On empiric rocephin. Now with resolution of infectious parameters.   Today Jill Stein is feeling much improved. Afebrile x 24 hrs. She is looking to get back to [facility] in next few days but would like definitive stone management prior.   Objective: Vital signs in last 24 hours: Temp:  [97.5 F (36.4 C)-97.8 F (36.6 C)] 97.8 F (36.6 C) (05/02 0515) Pulse Rate:  [70-81] 79 (05/02 0515) Resp:  [16-17] 16 (05/02 0515) BP: (109-121)/(69-74) 111/69 mmHg (05/02 0515) SpO2:  [100 %] 100 % (05/02 0515) Last BM Date: 06/22/14  Intake/Output from previous day: 05/01 0701 - 05/02 0700 In: 4488.3 [P.O.:2310; I.V.:2178.3] Out: 2900 [Urine:2900] Intake/Output this shift:    General appearance: alert, cooperative, appears stated age and father at bedside Eyes: negative Nose: Nares normal. Septum midline. Mucosa normal. No drainage or sinus tenderness. Throat: lips, mucosa, and tongue normal; teeth and gums normal Neck: supple, symmetrical, trachea midline Back: symmetric, no curvature. ROM normal. No CVA tenderness. Resp: non-labored on room air Cardio: Nl rate GI: soft, non-tender; bowel sounds normal; no masses,  no organomegaly Extremities: extremities normal, atraumatic, no cyanosis or edema Skin: Skin color, texture, turgor normal. No rashes or lesions Neurologic: Grossly normal  Lab Results:   Recent Labs  06/23/14 1630  WBC 20.2*  HGB 12.7  HCT 36.1  PLT 182   BMET  Recent Labs   06/23/14 1630 06/25/14 0601  NA 129* 140  K 4.4 4.0  CL 104 112*  CO2 19 21*  GLUCOSE 125* 143*  BUN 13 12  CREATININE 1.28* 0.82  CALCIUM 8.3* 8.4*   PT/INR No results for input(s): LABPROT, INR in the last 72 hours. ABG No results for input(s): PHART, HCO3 in the last 72 hours.  Invalid input(s): PCO2, PO2  Studies/Results: Dg Abd 1 View  06/24/2014   CLINICAL DATA:  Bilateral groin and pelvic pain. Worsening fever. Cystoscopy last night with stent placement. Possible stones. Possible lithotripsy plantae.  EXAM: ABDOMEN - 1 VIEW  COMPARISON:  None.  FINDINGS: A ureteral stent is present with 2 small round loops at the bladder and and a formed pigtail type loop of the right renal collecting system and. I do not see a definite calculus along the course of the stent, in the vicinity of the right collecting system, or projecting over the expected location of the left kidney an ureter. Renal shadows are somewhat obscured by stool in the colon.  Bowel gas pattern unremarkable.  IMPRESSION: 1. Right ureteral stent in place.  No definite visible stone.   Electronically Signed   By: Gaylyn RongWalter  Liebkemann M.D.   On: 06/24/2014 17:04    Anti-infectives: Anti-infectives    Start     Dose/Rate Route Frequency Ordered Stop   06/24/14 0800  cefTRIAXone (ROCEPHIN) 2 g in dextrose 5 % 50 mL IVPB - Premix     2 g 100 mL/hr over 30 Minutes Intravenous Every 24 hours 06/24/14 0317     06/23/14 1937  cefTRIAXone (  ROCEPHIN) 1 G injection    Comments:  [name]   : cabinet override      06/23/14 1937 06/23/14 1946   06/23/14 1930  cefTRIAXone (ROCEPHIN) 1 g in dextrose 5 % 50 mL IVPB     1 g 100 mL/hr over 30 Minutes Intravenous  Once 06/23/14 1918 06/23/14 2005      Assessment/Plan:  1 - Rt Ureteral Stone - now temporized with stent. Discussed additional theraputic options including medical therapy, URS, SWL. SWL not favored as stone not easily visible on KUB. Pt would like URS today if  possible with goal of stone free before starts summer break. This is reasonable.  We discussed ureteroscopic stone manipulation with basketing and laser-lithotripsy in detail.  We discussed risks including bleeding, infection, damage to kidney / ureter  bladder, rarely loss of kidney. We discussed anesthetic risks and rare but serious surgical complications including DVT, PE, MI, and mortality. We specifically addressed that in 5-10% of cases a staged approach is required with stenting followed by re-attempt ureteroscopy if anatomy unfavorable. The patient voiced understanding and wises to proceed.   Posted today as afernoon add-on. Remain NPO. Will cancel should any fevers return.  2- Urosepsis - resolved clinically. Remain current ABX now peri-op. I feel definitive stone management is acceptable at this point as she has been afebrile x 24 hrs and stone distal / not prolonged procedure.    [facility], [name] 06/25/2014

## 2014-06-25 NOTE — OR Nursing (Signed)
Stone taken by Dr. Manny 

## 2014-06-25 NOTE — Anesthesia Postprocedure Evaluation (Signed)
  Anesthesia Post-op Note  Patient: Jill Stein  Procedure(s) Performed: Procedure(s): CYSTOSCOPY WITH RETROGRADE PYELOGRAM, URETEROSCOPY WITH BASKETING OF STONE AND STENT EXCHANGE (Right) CYSTOSCOPY/URETEROSCOPY/HOLMIUM LASER (Right)  Patient Location: PACU  Anesthesia Type:General  Level of Consciousness: awake, alert , oriented and patient cooperative  Airway and Oxygen Therapy: Patient Spontanous Breathing  Post-op Pain: mild  Post-op Assessment: Post-op Vital signs reviewed, Patient's Cardiovascular Status Stable, Respiratory Function Stable, Patent Airway, No signs of Nausea or vomiting and Pain level controlled  Post-op Vital Signs: stable  Last Vitals:  Filed Vitals:   06/25/14 1713  BP:   Pulse: 75  Temp:   Resp: 15    Complications: No apparent anesthesia complications

## 2014-06-25 NOTE — Anesthesia Procedure Notes (Signed)
Procedure Name: LMA Insertion Date/Time: 06/25/2014 4:00 PM Performed by: Epimenio SarinJARVELA, Herson Prichard R Pre-anesthesia Checklist: Patient identified, Emergency Drugs available, Suction available, Patient being monitored and Timeout performed Patient Re-evaluated:Patient Re-evaluated prior to inductionOxygen Delivery Method: Circle system utilized Preoxygenation: Pre-oxygenation with 100% oxygen Intubation Type: IV induction Ventilation: Mask ventilation without difficulty LMA: LMA with gastric port inserted LMA Size: 3.0 Number of attempts: 1 Dental Injury: Teeth and Oropharynx as per pre-operative assessment

## 2014-06-26 ENCOUNTER — Encounter (HOSPITAL_COMMUNITY): Payer: Self-pay | Admitting: Urology

## 2014-06-26 MED ORDER — SULFAMETHOXAZOLE-TRIMETHOPRIM 800-160 MG PO TABS: 1.0000 | ORAL_TABLET | Freq: Two times a day (BID) | ORAL | Status: AC

## 2014-06-26 MED ORDER — OXYCODONE-ACETAMINOPHEN 5-325 MG PO TABS: 1.0000 | ORAL_TABLET | Freq: Four times a day (QID) | ORAL | Status: AC | PRN

## 2014-06-26 MED ORDER — SENNOSIDES-DOCUSATE SODIUM 8.6-50 MG PO TABS: 1.0000 | ORAL_TABLET | Freq: Two times a day (BID) | ORAL | Status: AC

## 2014-06-26 NOTE — Discharge Instructions (Signed)
1 - You may have urinary urgency (bladder spasms) and bloody urine on / off with stent in place. This is normal. ° °2 - Call MD or go to ER for fever >102, severe pain / nausea / vomiting not relieved by medications, or acute change in medical status ° °3 - Remove tethered stent on Friday morning at home by pulling on string, then blue-white plastic tubing, and discarding. ° ° °

## 2014-06-26 NOTE — Op Note (Signed)
NAMCrissie Reese:  Jill Stein, Jill Stein                ACCOUNT NO.:  [id number]  MEDICAL RECORD NO.:  [id number]  LOCATION:  1421                         [facility]  PHYSICIAN:  Sebastian Acheheodore Elodia Haviland, MD     DATE OF BIRTH:  07/13/1995  DATE OF PROCEDURE: 06/25/2014                              OPERATIVE REPORT  PREOPERATIVE DIAGNOSES: 1. Right distal ureteral stone. 2. History of prior urosepsis.  PROCEDURES: 1. Cystoscopy with right retrograde pyelogram interpretation. 2. Right ureteroscopy with basketing of stone. 3. Exchange of right ureteral stent, 5 x26 Polaris with tether.  ESTIMATED BLOOD LOSS:  Nil.  COMPLICATIONS:  None.  SPECIMEN:  Right distal ureteral stone for compositional analysis.  FINDINGS: 1. Unremarkable right retrograde pyelogram. 2. Solitary right distal ureteral stone with significant periureteral     edema. 3. No additional right-sided urinary calculi in the proximal ureter or     right renal pelvis. 4. Successful replacement of right ureteral stent, proximal in renal     pelvis and distal in urinary bladder.  INDICATION:  Jill Stein is a pleasant 19 year old young lady with recent history of right renal colic.  She was initially managed with medical therapy; however, she became febrile and acutely ill with impending urosepsis.  She subsequently underwent right ureteral stent placement. Because of intravenous antibiotics, she quickly defervesced and cleared infectious parameters and followup urine culture has been negative.  She now presents for definitive stone management, ureteroscopic stone manipulation.  Informed consent was obtained and placed in medical record.  DESCRIPTION OF PROCEDURE:  The patient being Jill Stein, procedure being right ureteroscopic stone manipulation was confirmed.  Procedure was carried out.  Time-out was performed.  Intravenous antibiotics were administered.  General LMA anesthesia was induced.  The patient was placed into a low  lithotomy position.  Sterile field was created by prepping and draping the patient's vagina, introitus, and proximal thighs using iodine x3.  Next, cystourethroscopy was performed using a 23-French rigid ureteroscope with 30-degree offset lens.  Inspection of urinary bladder revealed no diverticula, calcifications, papular lesions.  Distal end of right ureteral stent was seen in situ.  This was grasped proximal of the urethral meatus.  A 0.038 zip wire was advanced at the level of the kidney and set aside as a safety wire.  Next, semi- rigid ureteroscopy was performed to the distal right ureter alongside a separate Sensor working wire with an 8-French feeding tube in the urinary bladder for pressure release.  In the distal fourth of the ureter, a solitary stone was encountered.  There was significant mucosal edema and erythema associated with this.  The stone itself was not large and somewhat fusiform in shape and it was felt that was amenable to simple basketing.  As such, an escape-type basket was used to grasp the stone on its long axis and it was carefully navigated down in its entirety and set aside for compositional analysis.  Remainder of the length of the right ureter revealed no mucosal abnormalities or additional calculi.  Next, the semi-rigid ureteroscope was exchanged for the dual-channel flexible digital ureteroscope over the Sensor working wire to the level of the kidney.  Next, flexible ureteroscopic examination was performed  in kidney, inspecting each calyx x2.  There were no mucosal abnormalities or additional calculi noted.  The ureteroscope was removed.  On continuous ureteroscopic vision, no mucosal abnormalities were found except for the significant edema in the distal ureter, the site of prior stone impaction.  It was felt that additional time with ureteral stent was warranted given the significant distal edema, but that a tethered stent would be acceptable.  As  such, right retrograde pyelogram was obtained via flexible ureteroscope.  Retrograde pyelogram, there was single right ureter, single system right kidney.  There was some tortuosity in the proximal ureter as noted previously.  No significant hydroureteronephrosis.  Finally, a new 5 x 26 Polaris type stent was placed over the remaining safety wire using fluoroscopic guidance.  Good proximal and distal deployment were noted. Bladder was emptied per cystoscope.  Tether was fashioned to the thigh. Procedure terminated.  The patient tolerated the procedure well.  There were no immediate periprocedural complications.  The patient was taken to the postanesthesia care unit in stable condition.          ______________________________ Sebastian Ache, MD     TM/MEDQ  D:  06/25/2014  T:  06/26/2014  Job:  [id number]

## 2014-06-26 NOTE — Progress Notes (Signed)
Patient discharged home with dad, discharge instructions given and explained, and they verbalized understanding. Patient denies any distress. Skin intact, no wound.

## 2014-06-26 NOTE — Care Management Note (Signed)
Case Management Note  Patient Details  Name: Jill Stein MRN: [id number] Date of Birth: Aug 18, 1995  Subjective/Objective:   19  Y/o f admitted w/ureteral stone.                 Action/Plan:Monitor progress for d/c needs.   Expected Discharge Date:  06/25/14               Expected Discharge Plan:  Home/Self Care  In-House Referral:     Discharge planning Services  CM Consult  Post Acute Care Choice:    Choice offered to:     DME Arranged:    DME Agency:     HH Arranged:    HH Agency:     Status of Service:  Completed, signed off  Medicare Important Message Given:    Date Medicare IM Given:    Medicare IM give by:    Date Additional Medicare IM Given:    Additional Medicare Important Message give by:     If discussed at Long Length of Stay Meetings, dates discussed:    Additional Comments:d/c home today, no needs or orders.  Jill Stein, Jill Casali, RN 06/26/2014, 11:34 AM

## 2014-06-26 NOTE — Discharge Summary (Signed)
Physician Discharge Summary  Patient ID: Jill ReeseKelsey Josten MRN: [id number] DOB/AGE: February 04, 1996 19 y.o.  Admit date: 06/23/2014 Discharge date: 06/26/2014  Admission Diagnoses:  Discharge Diagnoses:  Active Problems:   Ureteral stone with hydronephrosis   Discharged Condition: good  Hospital Course:   1 - Rt Ureteral Stone - Pt with 3mm Rt UVJ stone [facility], given trial of medical therapy but deneis interval passage. Underwent Rt JJ stent 5/2 in setting of febrile UTI and retrograde confirmed likely small distal stone.  Stone not easily seen on KUB post-stent. After afebrile x 24 hrs pt then underwenr right ureteroscopy with basekting of stone and exchange of right stent with tethered stent 06/25/14.   2- Urosepsis - Pt with new fevers to 103, leukocytosis to 20K and tachycardia + bacteruria / pyuria c/w early urosepsis.On empiric rocephin. Now with resolution of infectious parameters. Final UCX (obtained around time of first ABX dose) negative.   By 5/3, the day of discharge, she is ambulatory, pain controlled on PO meds, afebrile and felt to be adequate for discharge.   Consults: None  Significant Diagnostic Studies: labs: final UCX negative.   Treatments: surgery: as per above.   Discharge Exam: Blood pressure 100/49, pulse 58, temperature 97.9 F (36.6 C), temperature source Oral, resp. rate 18, height 5\' 7"  (1.702 m), weight 87.9 kg (193 lb 12.6 oz), last menstrual period 06/08/2014, SpO2 100 %. General appearance: alert, cooperative and appears stated age Head: Normocephalic, without obvious abnormality, atraumatic Nose: Nares normal. Septum midline. Mucosa normal. No drainage or sinus tenderness. Throat: lips, mucosa, and tongue normal; teeth and gums normal Neck: supple, symmetrical, trachea midline Back: symmetric, no curvature. ROM normal. No CVA tenderness. Resp: non-labored on room air Cardio: Nl rate GI: soft, non-tender; bowel  sounds normal; no masses,  no organomegaly Extremities: extremities normal, atraumatic, no cyanosis or edema Pulses: 2+ and symmetric Skin: Skin color, texture, turgor normal. No rashes or lesions Lymph nodes: Cervical, supraclavicular, and axillary nodes normal. Neurologic: Grossly normal  Disposition: Final discharge disposition not confirmed     Medication List    STOP taking these medications        HYDROcodone-acetaminophen 5-325 MG per tablet  Commonly known as:  NORCO/VICODIN     tamsulosin 0.4 MG Caps capsule  Commonly known as:  FLOMAX      TAKE these medications        levothyroxine 50 MCG tablet  Commonly known as:  SYNTHROID, LEVOTHROID  Take 50 mcg by mouth daily.     oxyCODONE-acetaminophen 5-325 MG per tablet  Commonly known as:  ROXICET  Take 1-2 tablets by mouth every 6 (six) hours as needed for moderate pain or severe pain. Post-operatively     promethazine 25 MG tablet  Commonly known as:  PHENERGAN  Take 25 mg by mouth every 6 (six) hours as needed for nausea.     senna-docusate 8.6-50 MG per tablet  Commonly known as:  Senokot-S  Take 1 tablet by mouth 2 (two) times daily. While taking pain meds to prevent constipation     sulfamethoxazole-trimethoprim 800-160 MG per tablet  Commonly known as:  BACTRIM DS,SEPTRA DS  Take 1 tablet by mouth 2 (two) times daily. X 5 days to prevent post-op infection           Follow-up Information    Follow up with Sebastian AcheMANNY, Marranda Arakelian, MD.   Specialty:  Urology   Why:  in about 1 month for follow  up and complete evaluation for kidney stone prevention   Contact information:   [address] [place] [address] [phone]       Signed: Sebastian Ache 06/26/2014, 8:01 AM

## 2016-05-07 IMAGING — CR DG ABDOMEN 1V
1 series · 1 of 1 positions shown · non-contrast
Comparison: None.

CLINICAL DATA: Bilateral groin and pelvic pain. Worsening fever.
Cystoscopy last night with stent placement. Possible stones.
Possible lithotripsy plantae.

EXAM:
ABDOMEN - 1 VIEW

[t abdomen supine]
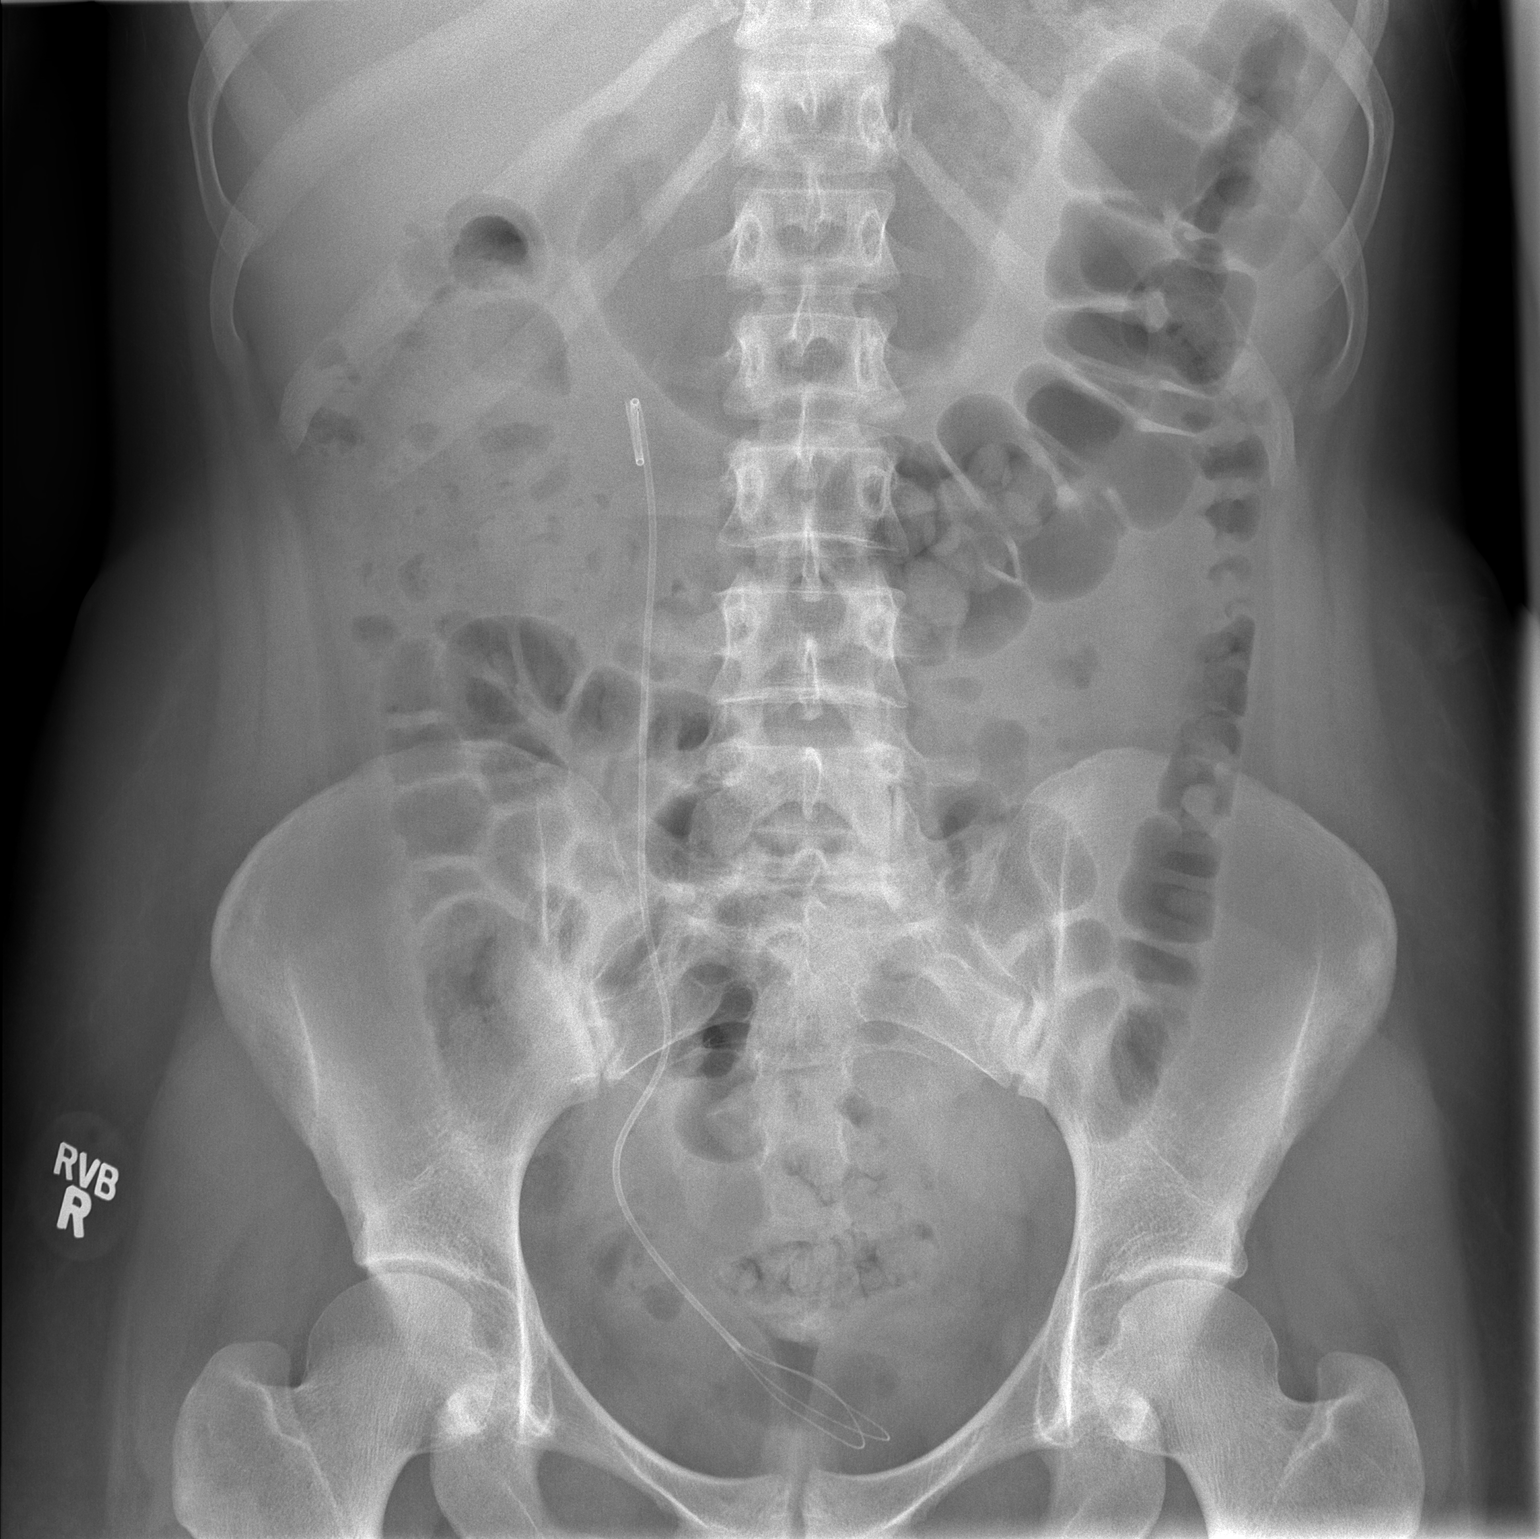

[1 of 1 positions shown; findings below may reference images not displayed]

FINDINGS: A ureteral stent is present with 2 small round loops at the bladder
and and a formed pigtail type loop of the right renal collecting
system and. I do not see a definite calculus along the course of the
stent, in the vicinity of the right collecting system, or projecting
over the expected location of the left kidney an ureter. Renal
shadows are somewhat obscured by stool in the colon.

Bowel gas pattern unremarkable.
IMPRESSION: 1. Right ureteral stent in place.  No definite visible stone.
